# Patient Record
Sex: Male | Born: 2005 | Race: Black or African American | Hispanic: No | Marital: Single | Smoking: Never smoker
Health system: Southern US, Community
[De-identification: ages and names within clinical notes are randomized; demographics above are authoritative.]

## PROBLEM LIST (undated history)

## (undated) ENCOUNTER — Emergency Department (HOSPITAL_COMMUNITY): Payer: Self-pay | Source: Home / Self Care

---
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
## 2023-08-31 ENCOUNTER — Telehealth: Payer: Self-pay | Admitting: Emergency Medicine

## 2023-08-31 ENCOUNTER — Ambulatory Visit: Admission: EM | Admit: 2023-08-31 | Discharge: 2023-08-31 | Disposition: A | Discharge status: Home / Self Care

## 2023-08-31 ENCOUNTER — Other Ambulatory Visit: Payer: Self-pay

## 2023-08-31 DIAGNOSIS — J069 Acute upper respiratory infection, unspecified: Secondary | ICD-10-CM | POA: Diagnosis present

## 2023-08-31 LAB — RESP PANEL BY RT-PCR (FLU A&B, COVID) ARPGX2
Influenza A by PCR: NEGATIVE
Influenza B by PCR: NEGATIVE
SARS Coronavirus 2 by RT PCR: POSITIVE — AB

## 2023-08-31 MED ORDER — AZELASTINE HCL 0.1 % NA SOLN: 1.0000 | Freq: Two times a day (BID) | NASAL | 1 refills | Status: AC

## 2023-08-31 NOTE — Telephone Encounter (Signed)
 LMTCB to advise of positive Covid results.

## 2023-08-31 NOTE — Discharge Instructions (Addendum)
  1. Viral URI (Primary) - Resp Panel by RT-PCR (Flu A&B, Covid) swab collected in UC sent to lab for further testing results are pending when available you will be contacted if results are positive. - azelastine  (ASTELIN ) 0.1 % nasal spray; Place 1 spray into both nostrils 2 (two) times daily. Use in each nostril as directed  Dispense: 30 mL; Refill: 1 - Continue using ibuprofen or Tylenol as needed for headache, fever, body aches secondary to viral illness. -Continue to monitor symptoms for any change in severity if there is any escalation of current symptoms or development of new symptoms follow-up in ER for further evaluation and management.

## 2023-08-31 NOTE — ED Triage Notes (Signed)
 Pt is here with a headache and nasal congestion that started at 9am today.

## 2023-08-31 NOTE — ED Provider Notes (Signed)
 [facility]  Note:  This document was prepared using Conservation officer, historic buildings and may include unintentional dictation errors.  MRN: [id number] DOB: 31-Jul-2005  Subjective:   Patrick Leblanc is a 18 y.o. male presenting for headache, nasal congestion that started this morning around 9 AM.  Patient denies any coughing, fever, body aches, sore throat.  Patient denies any known sick contacts.  No shortness of breath, chest pain, weakness, dizziness.  Patient has been given Tylenol for fever earlier today and headache with mild improvement.  Patient's grandmother was just concerned that he might have COVID and would like testing today in urgent care.  No current facility-administered medications for this encounter.  Current Outpatient Medications:    azelastine  (ASTELIN ) 0.1 % nasal spray, Place 1 spray into both nostrils 2 (two) times daily. Use in each nostril as directed, Disp: 30 mL, Rfl: 1   dicyclomine  (BENTYL ) 10 MG capsule, Take 1 capsule (10 mg total) by mouth 3 (three) times daily before meals for 14 days., Disp: 42 capsule, Rfl: 0   Allergies  Allergen Reactions   Pecan Extract Dermatitis   Pecan Nut (Diagnostic) Rash    History reviewed. No pertinent past medical history.   History reviewed. No pertinent surgical history.  Family History  Problem Relation Age of Onset   Colon cancer Mother    Healthy Father     Social History   Tobacco Use   Smoking status: Never   Smokeless tobacco: Never  Vaping Use   Vaping status: Never Used  Substance Use Topics   Alcohol use: Never   Drug use: Never    ROS Refer to HPI for ROS details.  Objective:   Vitals: BP 119/61 (BP Location: Left Arm)   Pulse 95   Temp 98.2 F (36.8 C) (Oral)   Resp 17   SpO2 99%   Physical Exam Vitals and nursing note reviewed.  Constitutional:      General: He is not in acute distress.    Appearance: He is well-developed. He is not ill-appearing or  toxic-appearing.  HENT:     Head: Normocephalic.     Nose: Congestion present.     Mouth/Throat:     Mouth: Mucous membranes are moist.     Pharynx: Oropharynx is clear. No posterior oropharyngeal erythema.  Eyes:     General:        Right eye: No discharge.        Left eye: No discharge.     Extraocular Movements: Extraocular movements intact.     Conjunctiva/sclera: Conjunctivae normal.  Cardiovascular:     Rate and Rhythm: Normal rate and regular rhythm.     Heart sounds: Normal heart sounds. No murmur heard. Pulmonary:     Effort: Pulmonary effort is normal. No respiratory distress.     Breath sounds: Normal breath sounds. No stridor. No wheezing, rhonchi or rales.  Chest:     Chest wall: No tenderness.  Skin:    General: Skin is warm and dry.  Neurological:     General: No focal deficit present.     Mental Status: He is alert and oriented to person, place, and time.  Psychiatric:        Mood and Affect: Mood normal.        Behavior: Behavior normal.     Procedures  No results found for this or any previous visit (from the past 24 hours).  Assessment and Plan :     Discharge Instructions  1. Viral URI (Primary) - Resp Panel by RT-PCR (Flu A&B, Covid) swab collected in UC sent to lab for further testing results are pending when available you will be contacted if results are positive. - azelastine  (ASTELIN ) 0.1 % nasal spray; Place 1 spray into both nostrils 2 (two) times daily. Use in each nostril as directed  Dispense: 30 mL; Refill: 1 - Continue using ibuprofen or Tylenol as needed for headache, fever, body aches secondary to viral illness. -Continue to monitor symptoms for any change in severity if there is any escalation of current symptoms or development of new symptoms follow-up in ER for further evaluation and management.      Bascom Biel B Charlotte Brafford   Khristine Verno, [place], [place] 08/31/23 1836

## 2023-09-01 ENCOUNTER — Ambulatory Visit (HOSPITAL_COMMUNITY): Payer: Self-pay
